# Patient Record
Sex: Male | Born: 1965 | Race: White | Hispanic: No | Smoking: Current every day smoker
Health system: Southern US, Community
[De-identification: ages and names within clinical notes are randomized; demographics above are authoritative.]

---

## 2017-09-16 ENCOUNTER — Encounter: Payer: Self-pay | Admitting: Emergency Medicine

## 2017-09-16 ENCOUNTER — Other Ambulatory Visit: Payer: Self-pay

## 2017-09-16 ENCOUNTER — Emergency Department (INDEPENDENT_AMBULATORY_CARE_PROVIDER_SITE_OTHER)
Admission: EM | Admit: 2017-09-16 | Discharge: 2017-09-16 | Disposition: A | Discharge status: Home / Self Care | Payer: BLUE CROSS/BLUE SHIELD | Source: Home / Self Care | Attending: Family Medicine | Admitting: Family Medicine

## 2017-09-16 DIAGNOSIS — L729 Follicular cyst of the skin and subcutaneous tissue, unspecified: Secondary | ICD-10-CM

## 2017-09-16 DIAGNOSIS — L089 Local infection of the skin and subcutaneous tissue, unspecified: Secondary | ICD-10-CM

## 2017-09-16 MED ORDER — CEPHALEXIN 500 MG PO CAPS: 500.0000 mg | ORAL_CAPSULE | Freq: Two times a day (BID) | ORAL | 0 refills | Status: DC

## 2017-09-16 NOTE — ED Triage Notes (Signed)
Cyst on mid back x 2-3 months.

## 2017-09-16 NOTE — ED Provider Notes (Signed)
[facility]    CSN: [id number] Arrival date & time: 09/16/17  0944     History   Chief Complaint Chief Complaint  Patient presents with  . Cyst    HPI Charles MareDaron Dolezal is a 51 y.o. male.   HPI  Charles French is a 51 y.o. male presenting to UC with c/o cyst on mid back for the last 2-3 months that has become gradually more irritated.  A friend tried to lance it the other day but only got a small amount of blood out.  Pain is minimal for pt but due to location of cyst, sitting back is most uncomfortable for him.  Hx of similar cyst on his shoulder a few years ago that needed to be cut out completely.  He does not have a PCP.   History reviewed. No pertinent past medical history.  There are no active problems to display for this patient.   History reviewed. No pertinent surgical history.     Home Medications    Prior to Admission medications   Medication Sig Start Date End Date Taking? Authorizing Provider  cephALEXin (KEFLEX) 500 MG capsule Take 1 capsule (500 mg total) by mouth 2 (two) times daily. 09/16/17   Lurene ShadowPhelps, Irfan Veal O, PA-C    Family History No family history on file.  Social History Social History   Tobacco Use  . Smoking status: Current Every Day Smoker  . Smokeless tobacco: Never Used  Substance Use Topics  . Alcohol use: Yes  . Drug use: No     Allergies   Patient has no known allergies.   Review of Systems Review of Systems  Constitutional: Negative for chills and fever.  Skin: Color change:  unsure, difficulty to see due to location.     Physical Exam Triage Vital Signs ED Triage Vitals  Enc Vitals Group     BP 09/16/17 1007 134/85     Pulse Rate 09/16/17 1007 88     Resp --      Temp 09/16/17 1007 98.4 F (36.9 C)     Temp Source 09/16/17 1007 Oral     SpO2 09/16/17 1007 99 %     Weight 09/16/17 1008 151 lb (68.5 kg)     Height 09/16/17 1008 5\' 5"  (1.651 m)     Head Circumference --      Peak Flow --      Pain Score  09/16/17 1008 0     Pain Loc --      Pain Edu? --      Excl. in GC? --    No data found.  Updated Vital Signs BP 134/85 (BP Location: Right Arm)   Pulse 88   Temp 98.4 F (36.9 C) (Oral)   Ht 5\' 5"  (1.651 m)   Wt 151 lb (68.5 kg)   SpO2 99%   BMI 25.13 kg/m   Visual Acuity Right Eye Distance:   Left Eye Distance:   Bilateral Distance:    Right Eye Near:   Left Eye Near:    Bilateral Near:     Physical Exam  Constitutional: He is oriented to person, place, and time. He appears well-developed and well-nourished.  HENT:  Head: Normocephalic and atraumatic.  Eyes: EOM are normal.  Neck: Normal range of motion.  Cardiovascular: Normal rate.  Pulmonary/Chest: Effort normal.  Musculoskeletal: Normal range of motion.  Neurological: He is alert and oriented to person, place, and time.  Skin: Skin is warm and dry. There is  erythema.     Mid back, just Right of spine: 2x3cm hard slightly mobile mass. Erythematous. Non-tender. Skin in tact. No bleeding or discharge. No red streaking.   Psychiatric: He has a normal mood and affect. His behavior is normal.  Nursing note and vitals reviewed.    UC Treatments / Results  Labs (all labs ordered are listed, but only abnormal results are displayed) Labs Reviewed - No data to display  EKG  EKG Interpretation None       Radiology No results found.  Procedures Procedures (including critical care time)  Medications Ordered in UC Medications - No data to display   Initial Impression / Assessment and Plan / UC Course  I have reviewed the triage vital signs and the nursing notes.  Pertinent labs & imaging results that were available during my care of the patient were reviewed by me and considered in my medical decision making (see chart for details).     Hx and exam c/w infected cyst.  Will start pt on Keflex and f/u with Dr. Benjamin Stainhekkekandam for removal of cyst.  Home care instructions provided. Pt agreeable with tx  plan.    Final Clinical Impressions(s) / UC Diagnoses   Final diagnoses:  Infected cyst of skin    ED Discharge Orders        Ordered    cephALEXin (KEFLEX) 500 MG capsule  2 times daily     09/16/17 1017       Controlled Substance Prescriptions [place] Controlled Substance Registry consulted? Not Applicable   Rolla Platehelps, Taiwo Fish O, PA-C 09/16/17 1052

## 2017-09-30 ENCOUNTER — Ambulatory Visit (INDEPENDENT_AMBULATORY_CARE_PROVIDER_SITE_OTHER): Payer: BLUE CROSS/BLUE SHIELD | Admitting: Sports Medicine

## 2017-09-30 ENCOUNTER — Encounter: Payer: Self-pay | Admitting: Sports Medicine

## 2017-09-30 DIAGNOSIS — L723 Sebaceous cyst: Secondary | ICD-10-CM | POA: Diagnosis not present

## 2017-09-30 MED ORDER — DOXYCYCLINE HYCLATE 100 MG PO TABS: 100.0000 mg | ORAL_TABLET | Freq: Two times a day (BID) | ORAL | 0 refills | Status: AC

## 2017-09-30 MED ORDER — PREDNISONE 50 MG PO TABS: ORAL_TABLET | ORAL | 0 refills | Status: DC

## 2017-09-30 NOTE — Assessment & Plan Note (Signed)
Still is somewhat red and inflamed, he only took a couple of doses of his Keflex and had diarrhea. Switching to doxycycline, adding 5 days of prednisone. Return to see me next Friday in a 30-minute slot for full surgical excision including capsule.

## 2017-09-30 NOTE — Progress Notes (Signed)
   Subjective:    I'm seeing this patient as a consultation for: Waylan RocherErin Phelps PA-C  CC: Skin mass  HPI: This is a pleasant 52 year old male, he was seen in the urgent care a couple of weeks ago, prescribed antibiotics which ended up giving him diarrhea, Keflex.  He only took a couple of doses.  Unfortunately he continues to have the sebaceous cyst, still somewhat red, inflamed.  He was here for surgical excision.  Symptoms are moderate, improving.  I reviewed the past medical history, family history, social history, surgical history, and allergies today and no changes were needed.  Please see the problem list section below in epic for further details.  Past Medical History: No past medical history on file. Past Surgical History: No past surgical history on file. Social History: Social History   Socioeconomic History  . Marital status: Divorced    Spouse name: None  . Number of children: None  . Years of education: None  . Highest education level: None  Social Needs  . Financial resource strain: None  . Food insecurity - worry: None  . Food insecurity - inability: None  . Transportation needs - medical: None  . Transportation needs - non-medical: None  Occupational History  . None  Tobacco Use  . Smoking status: Current Every Day Smoker  . Smokeless tobacco: Never Used  Substance and Sexual Activity  . Alcohol use: Yes  . Drug use: No  . Sexual activity: None  Other Topics Concern  . None  Social History Narrative  . None   Family History: No family history on file. Allergies: No Known Allergies Medications: See med rec.  Review of Systems: No headache, visual changes, nausea, vomiting, diarrhea, constipation, dizziness, abdominal pain, skin rash, fevers, chills, night sweats, weight loss, swollen lymph nodes, body aches, joint swelling, muscle aches, chest pain, shortness of breath, mood changes, visual or auditory hallucinations.   Objective:   General: Well  Developed, well nourished, and in no acute distress.  Neuro:  Extra-ocular muscles intact, able to move all 4 extremities, sensation grossly intact.  Deep tendon reflexes tested were normal. Psych: Alert and oriented, mood congruent with affect. ENT:  Ears and nose appear unremarkable.  Hearing grossly normal. Neck: Unremarkable overall appearance, trachea midline.  No visible thyroid enlargement. Eyes: Conjunctivae and lids appear unremarkable.  Pupils equal and round. Skin: Warm and dry, no rashes noted.  There is a 4 cm sebaceous cyst, red, inflamed over the back. Cardiovascular: Pulses palpable, no extremity edema.  Impression and Recommendations:   This case required medical decision making of moderate complexity.  Inflamed sebaceous cyst Still is somewhat red and inflamed, he only took a couple of doses of his Keflex and had diarrhea. Switching to doxycycline, adding 5 days of prednisone. Return to see me next Friday in a 30-minute slot for full surgical excision including capsule.  ___________________________________________ Ihor Austinhomas J. Benjamin Stainhekkekandam, M.D., ABFM., CAQSM. Primary Care and Sports Medicine [facility]  Adjunct Instructor of Family Medicine  [employer]

## 2017-10-07 ENCOUNTER — Ambulatory Visit (INDEPENDENT_AMBULATORY_CARE_PROVIDER_SITE_OTHER): Payer: BLUE CROSS/BLUE SHIELD | Admitting: Sports Medicine

## 2017-10-07 ENCOUNTER — Encounter: Payer: Self-pay | Admitting: Sports Medicine

## 2017-10-07 DIAGNOSIS — L723 Sebaceous cyst: Secondary | ICD-10-CM | POA: Diagnosis not present

## 2017-10-07 NOTE — Assessment & Plan Note (Signed)
Excision as above. Return in 1 week for wound check and then 2 weeks from today for suture removal. We did discuss the need to avoid smoking, declines pain medicine.

## 2017-10-07 NOTE — Progress Notes (Signed)
   Procedure:  Excision of 5 cm right-sided posterior shoulder sebaceous cyst Risks, benefits, and alternatives explained and consent obtained. Time out conducted. Surface prepped with alcohol. 10cc lidocaine with epinephine infiltrated in a field block. Adequate anesthesia ensured. Area prepped and draped in a sterile fashion. Excision performed with: I made an elliptical incision over the sebaceous cyst, I then expressed the contents of the cyst, I carried the dissection down around the cyst capsule, the cyst capsule was adherent to the muscle fascia of the trapezius, so we had to remove a bit of the trapezius muscle fascia, this exposed the muscle belly of the trapezius, I closed this fascia with a single 3-0 Vicryl horizontal mattress suture, I was able to obtain good coverage and approximation of the fascia.  The rest of the wound was cleaned and inspected, and I closed the skin with #7 3-0 Ethilon simple interrupted sutures. Hemostasis achieved. Pt stable.

## 2017-10-07 NOTE — Patient Instructions (Signed)
Incision Care, Adult An incision is a surgical cut that is made through your skin. Most incisions are closed after surgery. Your incision may be closed with stitches (sutures), staples, skin glue, or adhesive strips. You may need to return to your health care provider to have sutures or staples removed. This may occur several days to several weeks after your surgery. The incision needs to be cared for properly to prevent infection. How to care for your incision Incision care   Follow instructions from your health care provider about how to take care of your incision. Make sure you: ? Wash your hands with soap and water before you change the bandage (dressing). If soap and water are not available, use hand sanitizer. ? Change your dressing as told by your health care provider. ? Leave sutures, skin glue, or adhesive strips in place. These skin closures may need to stay in place for 2 weeks or longer. If adhesive strip edges start to loosen and curl up, you may trim the loose edges. Do not remove adhesive strips completely unless your health care provider tells you to do that.  Check your incision area every day for signs of infection. Check for: ? More redness, swelling, or pain. ? More fluid or blood. ? Warmth. ? Pus or a bad smell.  Ask your health care provider how to clean the incision. This may include: ? Using mild soap and water. ? Using a clean towel to pat the incision dry after cleaning it. ? Applying a cream or ointment. Do this only as told by your health care provider. ? Covering the incision with a clean dressing.  Ask your health care provider when you can leave the incision uncovered.  Do not take baths, swim, or use a hot tub until your health care provider approves. Ask your health care provider if you can take showers. You may only be allowed to take sponge baths for bathing. Medicines  If you were prescribed an antibiotic medicine, cream, or ointment, take or apply the  antibiotic as told by your health care provider. Do not stop taking or applying the antibiotic even if your condition improves.  Take over-the-counter and prescription medicines only as told by your health care provider. General instructions  Limit movement around your incision to improve healing. ? Avoid straining, lifting, or exercise for the first month, or for as long as told by your health care provider. ? Follow instructions from your health care provider about returning to your normal activities. ? Ask your health care provider what activities are safe.  Protect your incision from the sun when you are outside for the first 6 months, or for as long as told by your health care provider. Apply sunscreen around the scar or cover it up.  Keep all follow-up visits as told by your health care provider. This is important. Contact a health care provider if:  Your have more redness, swelling, or pain around the incision.  You have more fluid or blood coming from the incision.  Your incision feels warm to the touch.  You have pus or a bad smell coming from the incision.  You have a fever or shaking chills.  You are nauseous or you vomit.  You are dizzy.  Your sutures or staples come undone. Get help right away if:  You have a red streak coming from your incision.  Your incision bleeds through the dressing and the bleeding does not stop with gentle pressure.  The edges of   your incision open up and separate.  You have severe pain.  You have a rash.  You are confused.  You faint.  You have trouble breathing and a fast heartbeat. This information is not intended to replace advice given to you by your health care provider. Make sure you discuss any questions you have with your health care provider. Document Released: 03/26/2005 Document Revised: 05/14/2016 Document Reviewed: 03/24/2016 Elsevier Interactive Patient Education  2018 Elsevier Inc.  

## 2017-10-14 ENCOUNTER — Encounter: Payer: Self-pay | Admitting: Sports Medicine

## 2017-10-14 ENCOUNTER — Ambulatory Visit: Payer: BLUE CROSS/BLUE SHIELD | Admitting: Sports Medicine

## 2017-10-14 DIAGNOSIS — L723 Sebaceous cyst: Secondary | ICD-10-CM

## 2017-10-14 NOTE — Progress Notes (Signed)
  Subjective: 1 week post surgical excision of a large sebaceous cyst on the right upper back, doing extremely well.  Objective: General: Well-developed, well-nourished, and in no acute distress. Incision: Clean, dry, intact, I removed all but 2 of the sutures and applied Steri-Strips for wound reinforcement.  Assessment/plan:   Inflamed sebaceous cyst Doing extremely well 1 week post excision. I removed all but 2 of the sutures and applied Steri-Strips for wound reinforcement, he will return at the middle of next week and a nurse visit for further suture removal. I am happy to poke my head and just to take a look at the wound when he comes in.  ___________________________________________ Ihor Austinhomas J. Benjamin Stainhekkekandam, M.D., ABFM., CAQSM. Primary Care and Sports Medicine [facility]  Adjunct Instructor of Family Medicine  [employer]

## 2017-10-14 NOTE — Assessment & Plan Note (Signed)
Doing extremely well 1 week post excision. I removed all but 2 of the sutures and applied Steri-Strips for wound reinforcement, he will return at the middle of next week and a nurse visit for further suture removal. I am happy to poke my head and just to take a look at the wound when he comes in.

## 2019-07-27 ENCOUNTER — Ambulatory Visit (INDEPENDENT_AMBULATORY_CARE_PROVIDER_SITE_OTHER): Payer: Managed Care, Other (non HMO) | Admitting: Sports Medicine

## 2019-07-27 ENCOUNTER — Encounter: Payer: Self-pay | Admitting: Sports Medicine

## 2019-07-27 ENCOUNTER — Ambulatory Visit (INDEPENDENT_AMBULATORY_CARE_PROVIDER_SITE_OTHER): Payer: Managed Care, Other (non HMO)

## 2019-07-27 ENCOUNTER — Other Ambulatory Visit: Payer: Self-pay

## 2019-07-27 DIAGNOSIS — M1711 Unilateral primary osteoarthritis, right knee: Secondary | ICD-10-CM

## 2019-07-27 DIAGNOSIS — L280 Lichen simplex chronicus: Secondary | ICD-10-CM | POA: Diagnosis not present

## 2019-07-27 DIAGNOSIS — M11261 Other chondrocalcinosis, right knee: Secondary | ICD-10-CM | POA: Diagnosis not present

## 2019-07-27 MED ORDER — CLOTRIMAZOLE-BETAMETHASONE 1-0.05 % EX CREA: 1.0000 "application " | TOPICAL_CREAM | Freq: Two times a day (BID) | CUTANEOUS | 0 refills | Status: AC

## 2019-07-27 NOTE — Progress Notes (Addendum)
Subjective:    CC: Right knee swelling  HPI: This is a pleasant 53 year old male, for the past several weeks he is noted severe swelling in his right knee, moderate discomfort, difficulty bending the knee, he feels as though he woke up 1 day with this, he does consume a good amount of alcohol.  It is hot, red, swollen.  In addition for the past several weeks he is noted a rash on the glans of his penis, completely asymptomatic, no new sexual partners, no discharge, urgency, frequency.  I reviewed the past medical history, family history, social history, surgical history, and allergies today and no changes were needed.  Please see the problem list section below in epic for further details.  Past Medical History: No past medical history on file. Past Surgical History: No past surgical history on file. Social History: Social History   Socioeconomic History  . Marital status: Divorced    Spouse name: Not on file  . Number of children: 4  . Years of education: Not on file  . Highest education level: Not on file  Occupational History  . Not on file  Social Needs  . Financial resource strain: Not on file  . Food insecurity    Worry: Not on file    Inability: Not on file  . Transportation needs    Medical: Not on file    Non-medical: Not on file  Tobacco Use  . Smoking status: Current Every Day Smoker  . Smokeless tobacco: Never Used  Substance and Sexual Activity  . Alcohol use: Yes  . Drug use: No  . Sexual activity: Not on file  Lifestyle  . Physical activity    Days per week: Not on file    Minutes per session: Not on file  . Stress: Not on file  Relationships  . Social Herbalist on phone: Not on file    Gets together: Not on file    Attends religious service: Not on file    Active member of club or organization: Not on file    Attends meetings of clubs or organizations: Not on file    Relationship status: Not on file  Other Topics Concern  . Not on  file  Social History Narrative  . Not on file   Family History: No family history on file. Allergies: No Known Allergies Medications: See med rec.  Review of Systems: No fevers, chills, night sweats, weight loss, chest pain, or shortness of breath.   Objective:    General: Well Developed, well nourished, and in no acute distress.  Neuro: Alert and oriented x3, extra-ocular muscles intact, sensation grossly intact.  HEENT: Normocephalic, atraumatic, pupils equal round reactive to light, neck supple, no masses, no lymphadenopathy, thyroid nonpalpable.  Skin: Warm and dry, no rashes. Cardiac: Regular rate and rhythm, no murmurs rubs or gallops, no lower extremity edema.  Respiratory: Clear to auscultation bilaterally. Not using accessory muscles, speaking in full sentences. Right knee: Normal to inspection with no erythema or effusion or obvious bony abnormalities. Palpation normal with no warmth or joint line tenderness or patellar tenderness or condyle tenderness. ROM normal in flexion and extension and lower leg rotation. Ligaments with solid consistent endpoints including ACL, PCL, LCL, MCL. Negative Mcmurray's and provocative meniscal tests. Non painful patellar compression. Patellar and quadriceps tendons unremarkable. Hamstring and quadriceps strength is normal. Genital exam: There is lichenification over the glans of the penis, no visible discharge, no masses.  Procedure: Real-time Ultrasound Guided  aspiration/injection of right knee Device: GE Logiq E  Verbal informed consent obtained.  Time-out conducted.  Noted no overlying erythema, induration, or other signs of local infection.  Skin prepped in a sterile fashion.  Local anesthesia: Topical Ethyl chloride.  With sterile technique and under real time ultrasound guidance:  I advanced an 18-gauge needle into the suprapatellar recess and aspirated approximately 30 mL of slightly cloudy, straw-colored fluid, syringe  switched and 1 cc Kenalog 40, 2 cc lidocaine, 2 cc bupivacaine injected easily Completed without difficulty  Pain immediately resolved suggesting accurate placement of the medication.  Advised to call if fevers/chills, erythema, induration, drainage, or persistent bleeding.  Images permanently stored and available for review in the ultrasound unit.  Impression: Technically successful ultrasound guided injection.  Impression and Recommendations:    Pseudogout of knee, right Aspiration and injection, crystal analysis, x-rays, home rehab exercises.  Calcium pyrophosphate crystals or arthrocentesis, not much OA on xrays, can use colchicine 0.6 mg BID for a week for flares.  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 He will establish care with one of our primary care providers here.   ___________________________________________ Ihor Austin. Benjamin Stain, M.D., ABFM., CAQSM. Primary Care and Sports Medicine [facility]  Adjunct Professor of Family Medicine  [employer]

## 2019-07-27 NOTE — Assessment & Plan Note (Addendum)
Aspiration and injection, crystal analysis, x-rays, home rehab exercises.  Calcium pyrophosphate crystals or arthrocentesis, not much OA on xrays, can use colchicine 0.6 mg BID for a week for flares.

## 2019-07-27 NOTE — Assessment & Plan Note (Signed)
This does appear to be simple lichenification of the glans, adding topical Lotrisone, he understands this can take weeks to months to treat. He will establish care with one of our primary care providers here.

## 2019-07-28 LAB — SYNOVIAL FLUID, CRYSTAL

## 2019-08-24 ENCOUNTER — Ambulatory Visit: Payer: Self-pay | Admitting: Sports Medicine

## 2020-10-20 IMAGING — DX DG KNEE 1-2V*L*
2 series · 2 of 2 positions shown · non-contrast
Comparison: None.

CLINICAL DATA: Right knee pain recent aspiration right knee
swelling

EXAM:
LEFT KNEE - 1-2 VIEW

[tunnel]
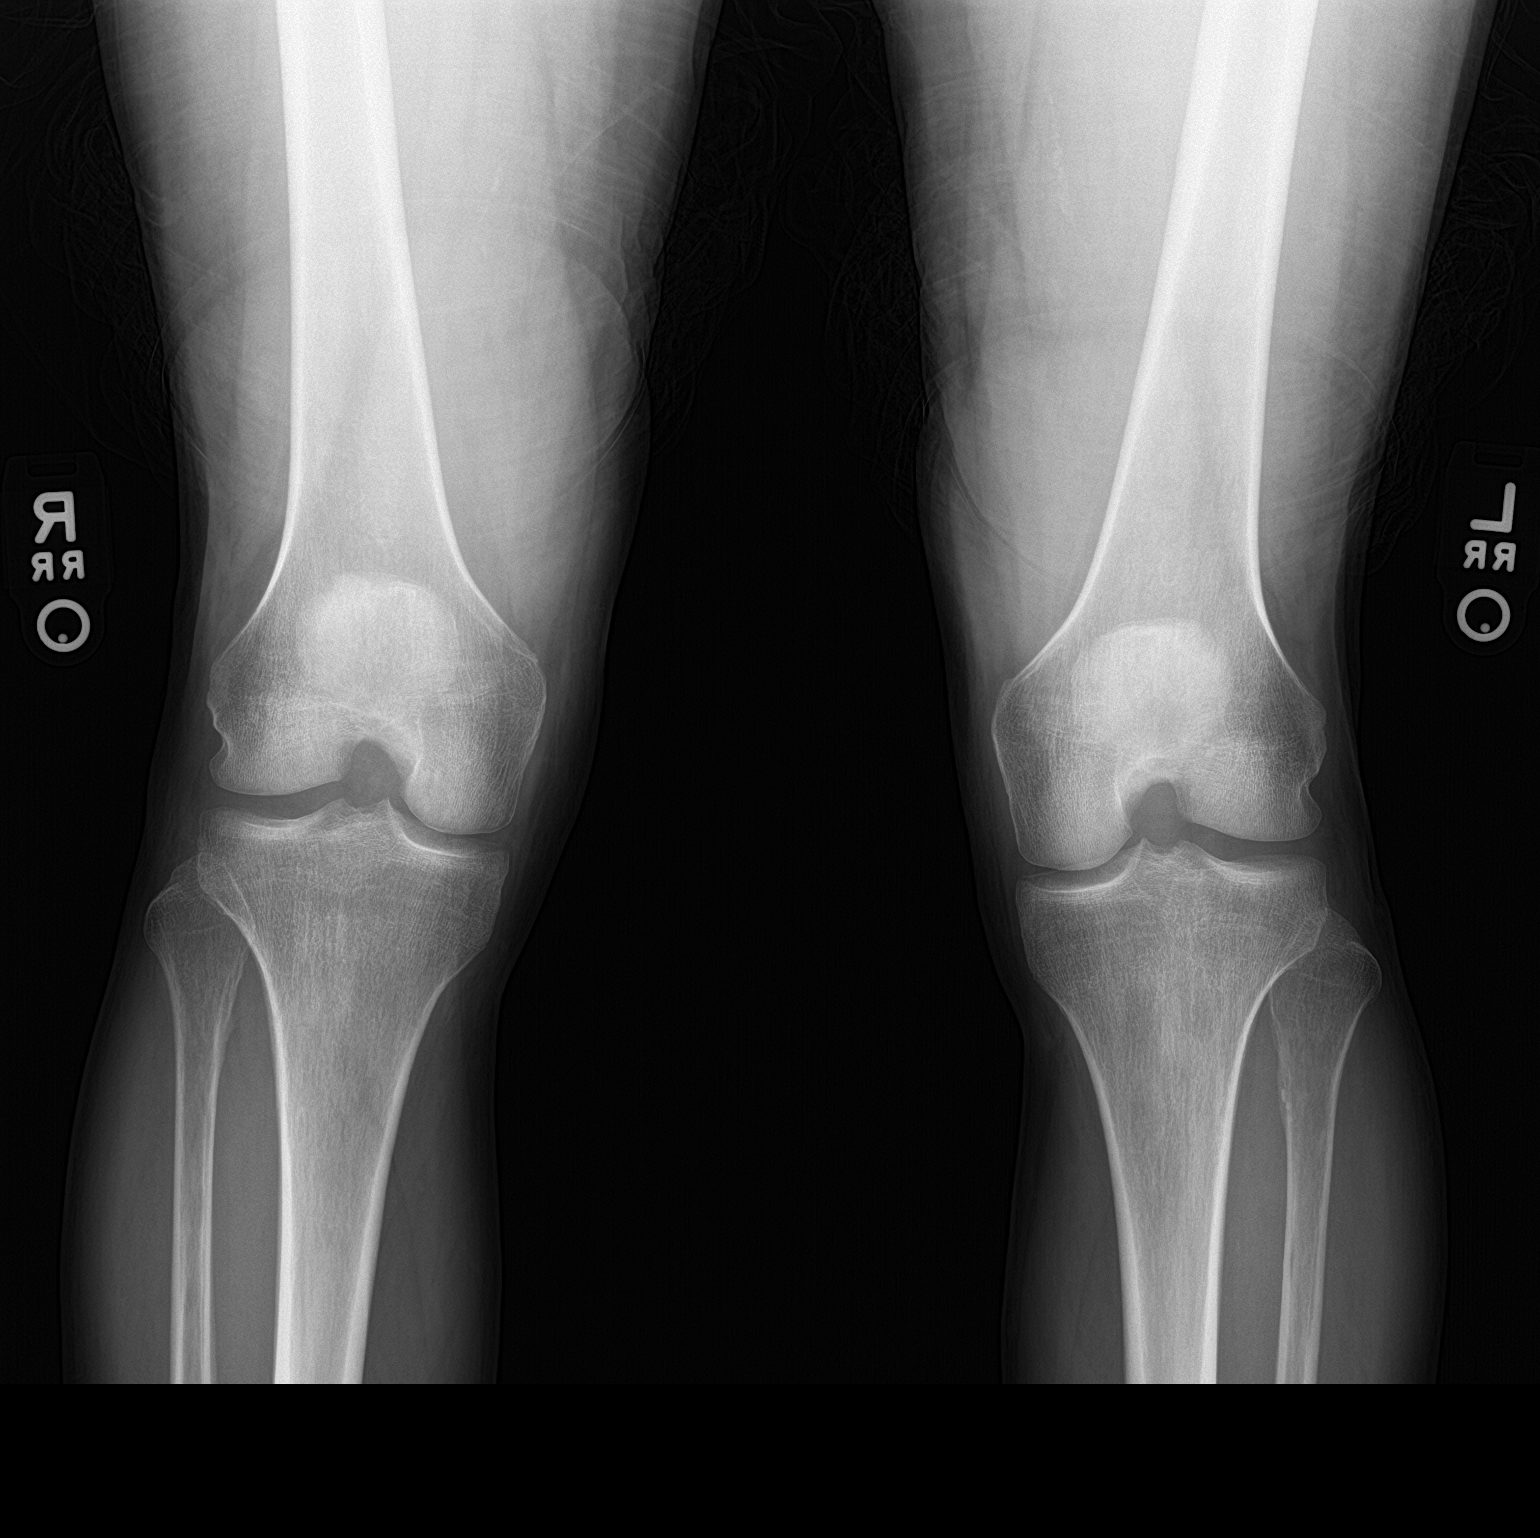

[knee ap bilat standing]
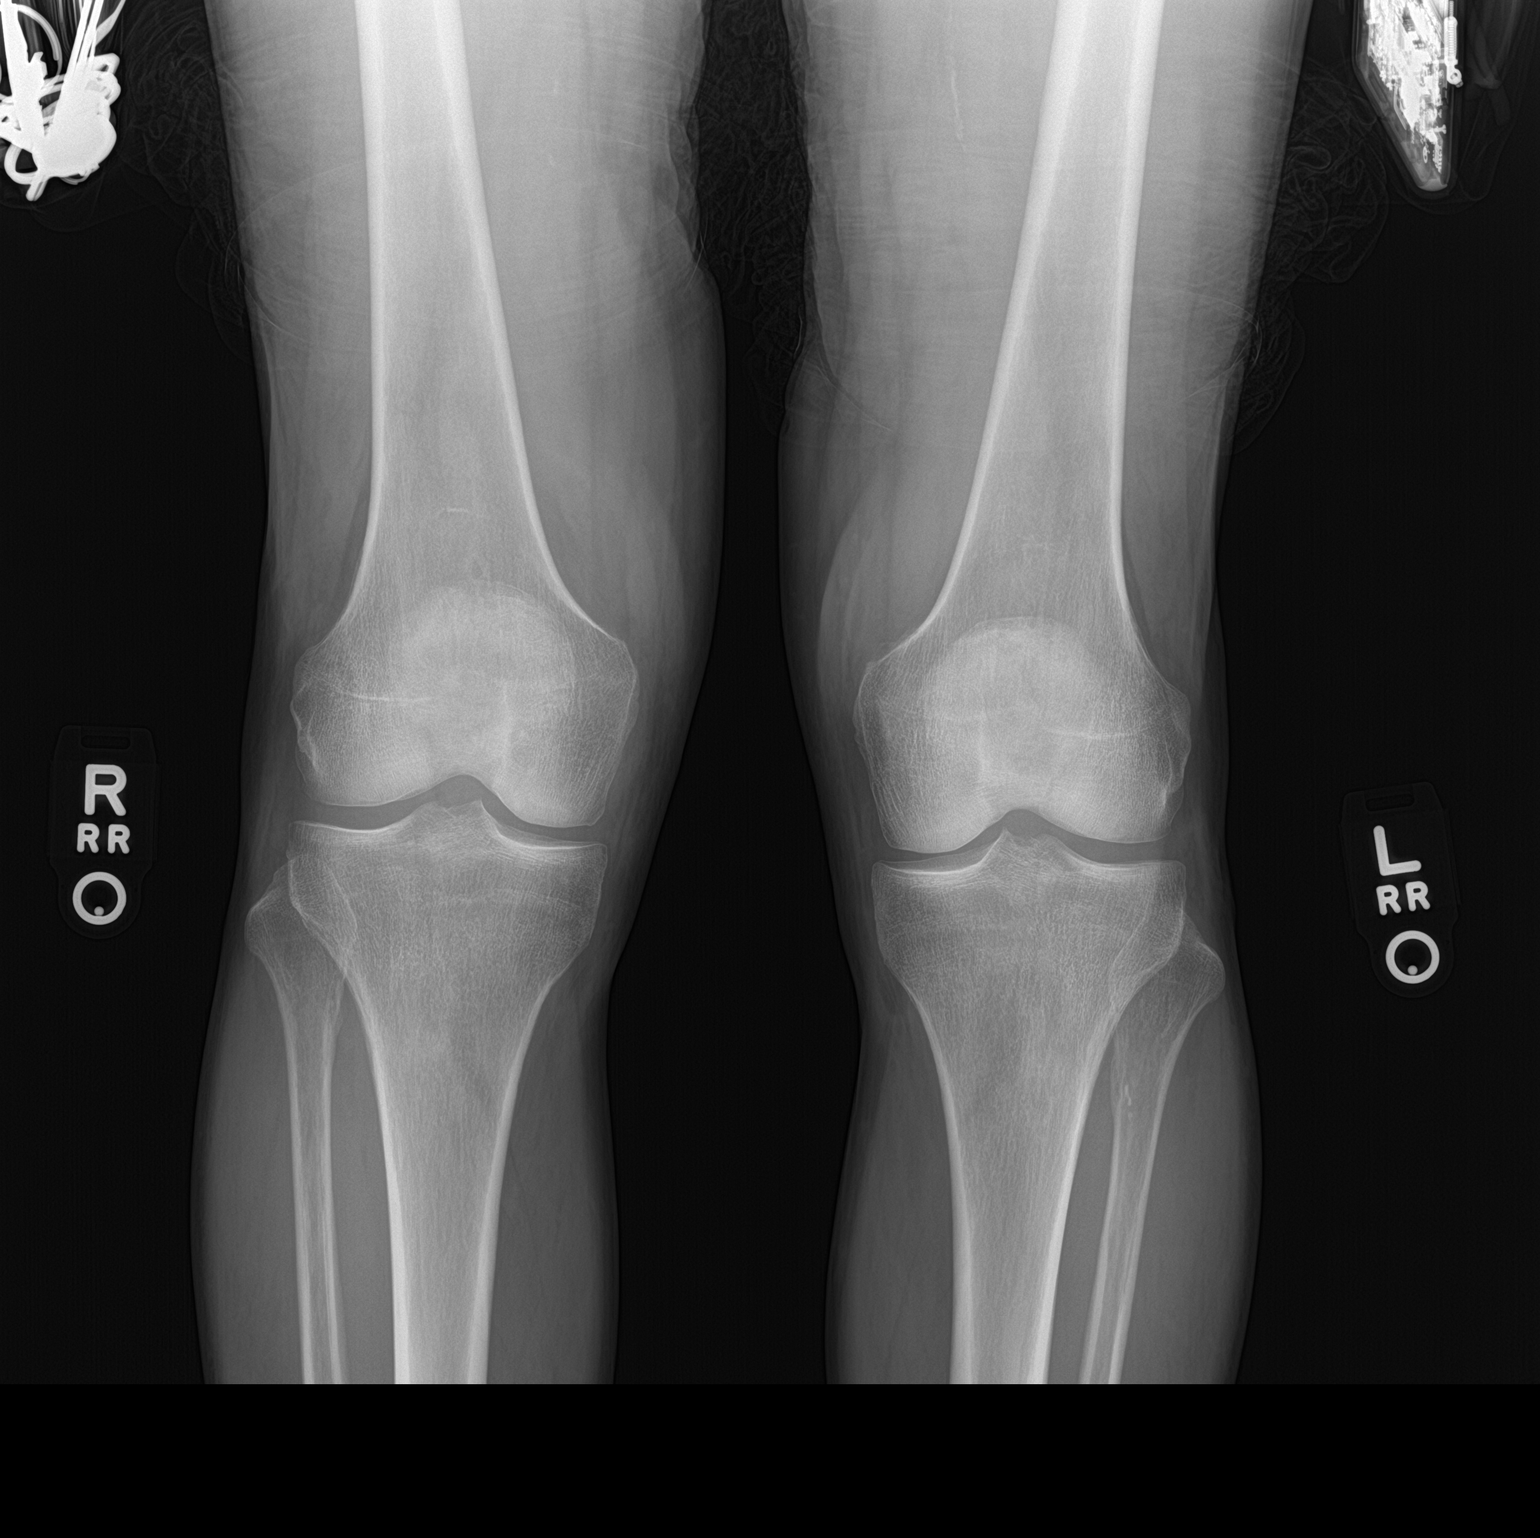

[2 of 2 positions shown; findings below may reference images not displayed]

FINDINGS: No evidence of fracture, or dislocation on 2 frontal views of the
left knee. No evidence of arthropathy or other focal bone
abnormality. Soft tissues are unremarkable.
IMPRESSION: Negative.

## 2021-06-29 ENCOUNTER — Ambulatory Visit: Payer: Managed Care, Other (non HMO) | Admitting: Family Medicine

## 2021-06-29 NOTE — Progress Notes (Deleted)
  Charles French - 55 y.o. male MRN [id number]  Date of birth: [birth year]  SUBJECTIVE:  Including CC & ROS.  No chief complaint on file.   Charles French is a 56 y.o. male that is  ***.  ***   Review of Systems See HPI   HISTORY: Past Medical, Surgical, Social, and Family History Reviewed & Updated per EMR.   Pertinent Historical Findings include:  No past medical history on file.  No past surgical history on file.  No family history on file.  Social History   Socioeconomic History   Marital status: Divorced    Spouse name: Not on file   Number of children: 4   Years of education: Not on file   Highest education level: Not on file  Occupational History   Not on file  Tobacco Use   Smoking status: Every Day   Smokeless tobacco: Never  Substance and Sexual Activity   Alcohol use: Yes   Drug use: No   Sexual activity: Not on file  Other Topics Concern   Not on file  Social History Narrative   Not on file   Social Determinants of Health   Financial Resource Strain: Not on file  Food Insecurity: Not on file  Transportation Needs: Not on file  Physical Activity: Not on file  Stress: Not on file  Social Connections: Not on file  Intimate Partner Violence: Not on file     PHYSICAL EXAM:  VS: There were no vitals taken for this visit. Physical Exam Gen: NAD, alert, cooperative with exam, well-appearing MSK:  ***      ASSESSMENT & PLAN:   No problem-specific Assessment & Plan notes found for this encounter.

## 2023-01-03 ENCOUNTER — Encounter: Payer: Self-pay | Admitting: *Deleted

## 2023-07-09 ENCOUNTER — Other Ambulatory Visit: Payer: Self-pay | Admitting: Sports Medicine

## 2023-07-09 DIAGNOSIS — L280 Lichen simplex chronicus: Secondary | ICD-10-CM
